# Patient Record
Sex: Male | Born: 1980 | Race: White | Hispanic: No | Marital: Married | Smoking: Never smoker
Health system: Southern US, Community
[De-identification: ages and names within clinical notes are randomized; demographics above are authoritative.]

## PROBLEM LIST (undated history)

## (undated) DIAGNOSIS — D689 Coagulation defect, unspecified: Secondary | ICD-10-CM

## (undated) HISTORY — DX: Coagulation defect, unspecified: D68.9

---

## 2014-08-20 ENCOUNTER — Ambulatory Visit (INDEPENDENT_AMBULATORY_CARE_PROVIDER_SITE_OTHER): Payer: BLUE CROSS/BLUE SHIELD

## 2014-08-20 ENCOUNTER — Ambulatory Visit (INDEPENDENT_AMBULATORY_CARE_PROVIDER_SITE_OTHER): Payer: BLUE CROSS/BLUE SHIELD | Admitting: Family Medicine

## 2014-08-20 VITALS — BP 112/78 | HR 95 | Temp 98.7°F | Resp 17 | Ht 67.0 in | Wt 239.6 lb

## 2014-08-20 DIAGNOSIS — R0989 Other specified symptoms and signs involving the circulatory and respiratory systems: Secondary | ICD-10-CM

## 2014-08-20 DIAGNOSIS — R509 Fever, unspecified: Secondary | ICD-10-CM | POA: Diagnosis not present

## 2014-08-20 DIAGNOSIS — R05 Cough: Secondary | ICD-10-CM

## 2014-08-20 DIAGNOSIS — J189 Pneumonia, unspecified organism: Secondary | ICD-10-CM | POA: Diagnosis not present

## 2014-08-20 DIAGNOSIS — R059 Cough, unspecified: Secondary | ICD-10-CM

## 2014-08-20 LAB — POCT CBC
Granulocyte percent: 70.4 %G (ref 37–80)
HCT, POC: 49.8 % (ref 43.5–53.7)
Hemoglobin: 16.3 g/dL (ref 14.1–18.1)
Lymph, poc: 1 (ref 0.6–3.4)
MCH, POC: 29.3 pg (ref 27–31.2)
MCHC: 32.7 g/dL (ref 31.8–35.4)
MCV: 89.5 fL (ref 80–97)
MID (cbc): 0.4 (ref 0–0.9)
MPV: 8.4 fL (ref 0–99.8)
POC Granulocyte: 3.2 (ref 2–6.9)
POC LYMPH PERCENT: 21.7 %L (ref 10–50)
POC MID %: 7.9 %M (ref 0–12)
Platelet Count, POC: 101 10*3/uL — AB (ref 142–424)
RBC: 5.56 M/uL (ref 4.69–6.13)
RDW, POC: 13 %
WBC: 4.6 10*3/uL (ref 4.6–10.2)

## 2014-08-20 LAB — POCT INFLUENZA A/B
Influenza A, POC: NEGATIVE
Influenza B, POC: NEGATIVE

## 2014-08-20 LAB — POCT SEDIMENTATION RATE: POCT SED RATE: 21 mm/hr (ref 0–22)

## 2014-08-20 MED ORDER — CEFTRIAXONE SODIUM 1 G IJ SOLR
1.0000 g | Freq: Once | INTRAMUSCULAR | Status: AC
  Administered 2014-08-20: 1 g via INTRAMUSCULAR

## 2014-08-20 MED ORDER — CEFTRIAXONE SODIUM 1 G IJ SOLR: 1.0000 g | INTRAMUSCULAR | Status: DC

## 2014-08-20 MED ORDER — AZITHROMYCIN 250 MG PO TABS
ORAL_TABLET | ORAL | Status: AC
Start: 2014-08-20 — End: ?

## 2014-08-20 MED ORDER — HYDROCODONE-HOMATROPINE 5-1.5 MG/5ML PO SYRP: 5.0000 mL | ORAL_SOLUTION | Freq: Three times a day (TID) | ORAL | Status: AC | PRN

## 2014-08-20 NOTE — Progress Notes (Signed)
° °  Subjective:    Patient ID: Brent Orr, male    DOB: 01/09/1981, 34 y.o.   MRN: [id number] This chart was scribed for Elvina SidleKurt Lauenstein, MD by Littie Deedsichard Sun, Medical Scribe. This patient was seen in Room 5 and the patient's care was started at 10:10 AM.   HPI HPI Comments: Brent Orr is a 34 y.o. male who presents to the [facility] complaining of gradual onset URI symptoms that started 3 days ago. Patient reports having fever, chest congestion, mild sore throat, mild nosebleed, a few episodes of vomiting, and productive cough (with blood). His fever has improved since onset 3 days ago. He has had sick contacts; they were also coughing up blood and had been diagnosed with bronchitis. Patient denies SOB and urinary symptoms. He also denies smoking. He has not received the flu shot this season.  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 He has 2 more years left of school.  Review of Systems  Constitutional: Positive for fever.  HENT: Positive for congestion, nosebleeds and sore throat.   Respiratory: Positive for cough. Negative for shortness of breath.   Gastrointestinal: Positive for vomiting.  Genitourinary: Negative.        Objective:   Physical Exam CONSTITUTIONAL: Well developed/well nourished HEAD: Normocephalic/atraumatic EYES: EOM/PERRL ENMT: Mucous membranes moist NECK: supple no meningeal signs SPINE: entire spine nontender CV: S1/S2 noted, no murmurs/rubs/gallops noted LUNGS: Lungs are clear to auscultation bilaterally, no apparent distress ABDOMEN: soft, nontender, no rebound or guarding GU: no cva tenderness NEURO: Pt is awake/alert, moves all extremitiesx4 EXTREMITIES: pulses normal, full ROM SKIN: warm, color normal PSYCH: no abnormalities of mood noted  [facility] (PRIMARY) x-ray report read by Dr. Milus GlazierLauenstein: Increased markings in right hilum and right lower lobe without definite infiltrate.       Assessment & Plan:   This  chart was scribed in my presence and reviewed by me personally.    ICD-9-CM ICD-10-CM   1. Cough 786.2 R05 POCT CBC     POCT Influenza A/B     POCT SEDIMENTATION RATE     DG Chest 2 View     azithromycin (ZITHROMAX Z-PAK) 250 MG tablet     HYDROcodone-homatropine (HYCODAN) 5-1.5 MG/5ML syrup     DISCONTINUED: cefTRIAXone (ROCEPHIN) injection 1 g  2. Fever, unspecified fever cause 780.60 R50.9 POCT CBC     POCT Influenza A/B     POCT SEDIMENTATION RATE     DG Chest 2 View     azithromycin (ZITHROMAX Z-PAK) 250 MG tablet     HYDROcodone-homatropine (HYCODAN) 5-1.5 MG/5ML syrup     DISCONTINUED: cefTRIAXone (ROCEPHIN) injection 1 g  3. Chest congestion 786.9 R09.89 POCT CBC     POCT Influenza A/B     POCT SEDIMENTATION RATE     DG Chest 2 View     azithromycin (ZITHROMAX Z-PAK) 250 MG tablet     HYDROcodone-homatropine (HYCODAN) 5-1.5 MG/5ML syrup     DISCONTINUED: cefTRIAXone (ROCEPHIN) injection 1 g  4. CAP (community acquired pneumonia) 486 J18.9 azithromycin (ZITHROMAX Z-PAK) 250 MG tablet     HYDROcodone-homatropine (HYCODAN) 5-1.5 MG/5ML syrup     cefTRIAXone (ROCEPHIN) injection 1 g     Signed, Elvina SidleKurt Lauenstein, MD

## 2014-08-20 NOTE — Patient Instructions (Signed)
He appeared to have an early pneumonia. I want you stay out of classroom and work for 48 hours but please return on Wednesday after 2 so we can recheck her you're doing.

## 2014-08-22 ENCOUNTER — Ambulatory Visit (INDEPENDENT_AMBULATORY_CARE_PROVIDER_SITE_OTHER): Payer: BLUE CROSS/BLUE SHIELD | Admitting: Family Medicine

## 2014-08-22 VITALS — BP 110/74 | HR 75 | Temp 98.8°F | Resp 20 | Ht 67.0 in | Wt 240.2 lb

## 2014-08-22 DIAGNOSIS — R0989 Other specified symptoms and signs involving the circulatory and respiratory systems: Secondary | ICD-10-CM | POA: Diagnosis not present

## 2014-08-22 DIAGNOSIS — J189 Pneumonia, unspecified organism: Secondary | ICD-10-CM | POA: Diagnosis not present

## 2014-08-22 DIAGNOSIS — R05 Cough: Secondary | ICD-10-CM

## 2014-08-22 DIAGNOSIS — R059 Cough, unspecified: Secondary | ICD-10-CM

## 2014-08-22 NOTE — Progress Notes (Signed)
Patient ID: Brent Orr, male   DOB: 1980-10-17, 34 y.o.   MRN: [id number]   This chart was scribed for Brent Sidle, MD by [facility], medical scribe at [facility].The patient was seen in exam room 11 and the patient's care was started at 2:42 PM.  Patient ID: Brent Orr MRN: [id number], DOB: 04-Sep-1980, 34 y.o. Date of Encounter: 08/22/2014  Primary Physician: No primary care provider on file.  Chief Complaint:  Chief Complaint  Patient presents with  . Follow-up    Dr. Elbert Ewings   HPI:  Brent Orr is a 34 y.o. male who presents to [facility] for a follow-up. He was last seen on 08/20/2014 diagnosed with pneumonia with associated cough, fever, and chest congestion. He was given a Z-pak which has provided relief.  Pt states his symptoms have improved, but his cough has persisted however he is no longer producing bloody phlegm. His energy levels are improving as well. Sleeping has improved.  Student at [employer].   Past Medical History  Diagnosis Date  . Clotting disorder     Home Meds: Prior to Admission medications   Medication Sig Start Date End Date Taking? Authorizing Provider  azithromycin (ZITHROMAX Z-PAK) 250 MG tablet Take as directed on pack 08/20/14  Yes Brent Sidle, MD  HYDROcodone-homatropine [facility]) 5-1.5 MG/5ML syrup Take 5 mLs by mouth every 8 (eight) hours as needed for cough. 08/20/14  Yes Brent Sidle, MD   Allergies:  Allergies  Allergen Reactions  . Keflex [Cephalexin]    History   Social History  . Marital Status: Married    Spouse Name: N/A  . Number of Children: N/A  . Years of Education: N/A   Occupational History  . Not on file.   Social History Main Topics  . Smoking status: Never Smoker   . Smokeless tobacco: Not on file  . Alcohol Use: Not on file  . Drug Use: Not on file  . Sexual Activity: Not on file   Other Topics Concern  . Not on file   Social History Narrative    Review of  Systems: Constitutional: negative for chills, fever, night sweats, weight changes, or fatigue  HEENT: negative for vision changes, hearing loss, congestion, rhinorrhea, ST, epistaxis, or sinus pressure Cardiovascular: negative for chest pain or palpitations Respiratory: negative for hemoptysis, shortness of breath. Positive for cough and wheezing. Abdominal: negative for abdominal pain, nausea, vomiting, diarrhea, or constipation Dermatological: negative for rash Neurologic: negative for headache, dizziness, or syncope All other systems reviewed and are otherwise negative with the exception to those above and in the HPI.  Physical Exam: Blood pressure 110/74, pulse 75, temperature 98.8 F (37.1 C), temperature source Oral, resp. rate 20, height  (1.702 m), weight 240 lb 3.2 oz (108.954 kg), SpO2 98 %., Body mass index is 37.61 kg/(m^2). General: Well developed, well nourished, in no acute distress. Head: Normocephalic, atraumatic, eyes without discharge, sclera non-icteric, nares are without discharge. Bilateral auditory canals clear, TM's are without perforation, pearly grey and translucent with reflective cone of light bilaterally. Oral cavity moist, posterior pharynx without exudate, erythema, peritonsillar abscess, or post nasal drip.  Neck: Supple. No thyromegaly. Full ROM. No lymphadenopathy. Lungs: Breathing is unlabored. Rales in the left base. Heart: RRR with S1 S2. No murmurs, rubs, or gallops appreciated. Abdomen: Soft, non-tender, non-distended with normoactive bowel sounds. No hepatomegaly. No rebound/guarding. No obvious abdominal masses. Msk:  Strength and tone normal for age. Extremities/Skin: Warm and  dry. No clubbing or cyanosis. No edema. No rashes or suspicious lesions. Neuro: Alert and oriented X 3. Moves all extremities spontaneously. Gait is normal. CNII-XII grossly in tact. Psych:  Responds to questions appropriately with a normal affect.   Labs:  ASSESSMENT AND  PLAN:  34 y.o. year old male with  This chart was scribed in my presence and reviewed by me personally.    ICD-9-CM ICD-10-CM   1. Cough 786.2 R05   2. Chest congestion 786.9 R09.89   3. CAP (community acquired pneumonia) 486 J18.9     Return to school tomorrow, finish the antibiotics   Signed, Brent SidleKurt Tametra Ahart, MD 08/22/2014 2:42 PM

## 2015-07-16 ENCOUNTER — Telehealth: Payer: Self-pay

## 2015-07-16 DIAGNOSIS — Z20828 Contact with and (suspected) exposure to other viral communicable diseases: Secondary | ICD-10-CM

## 2015-07-16 NOTE — Telephone Encounter (Signed)
Pt states his wife have been diagnosed with the flu and he would like to have [place] call him in some Tamiflu. Please call pt at [phone]    [facility] [address]

## 2015-07-16 NOTE — Telephone Encounter (Signed)
Does he have to come in.

## 2015-07-17 MED ORDER — OSELTAMIVIR PHOSPHATE 75 MG PO CAPS: 75.0000 mg | ORAL_CAPSULE | Freq: Every day | ORAL | Status: AC

## 2015-07-17 NOTE — Telephone Encounter (Signed)
I have called tamiflu in. He will take once a day for 10 days. Be sure to make pt aware of side effects - nausea, vomiting, diarrhea, headache.

## 2015-07-18 NOTE — Telephone Encounter (Signed)
LM Rx sent in. 

## 2015-10-23 IMAGING — CR DG CHEST 2V
2 series · 2 of 2 positions shown · non-contrast
Comparison: None.

CLINICAL DATA: Three-day history of cough and congestion

EXAM:
CHEST  2 VIEW

[PA]
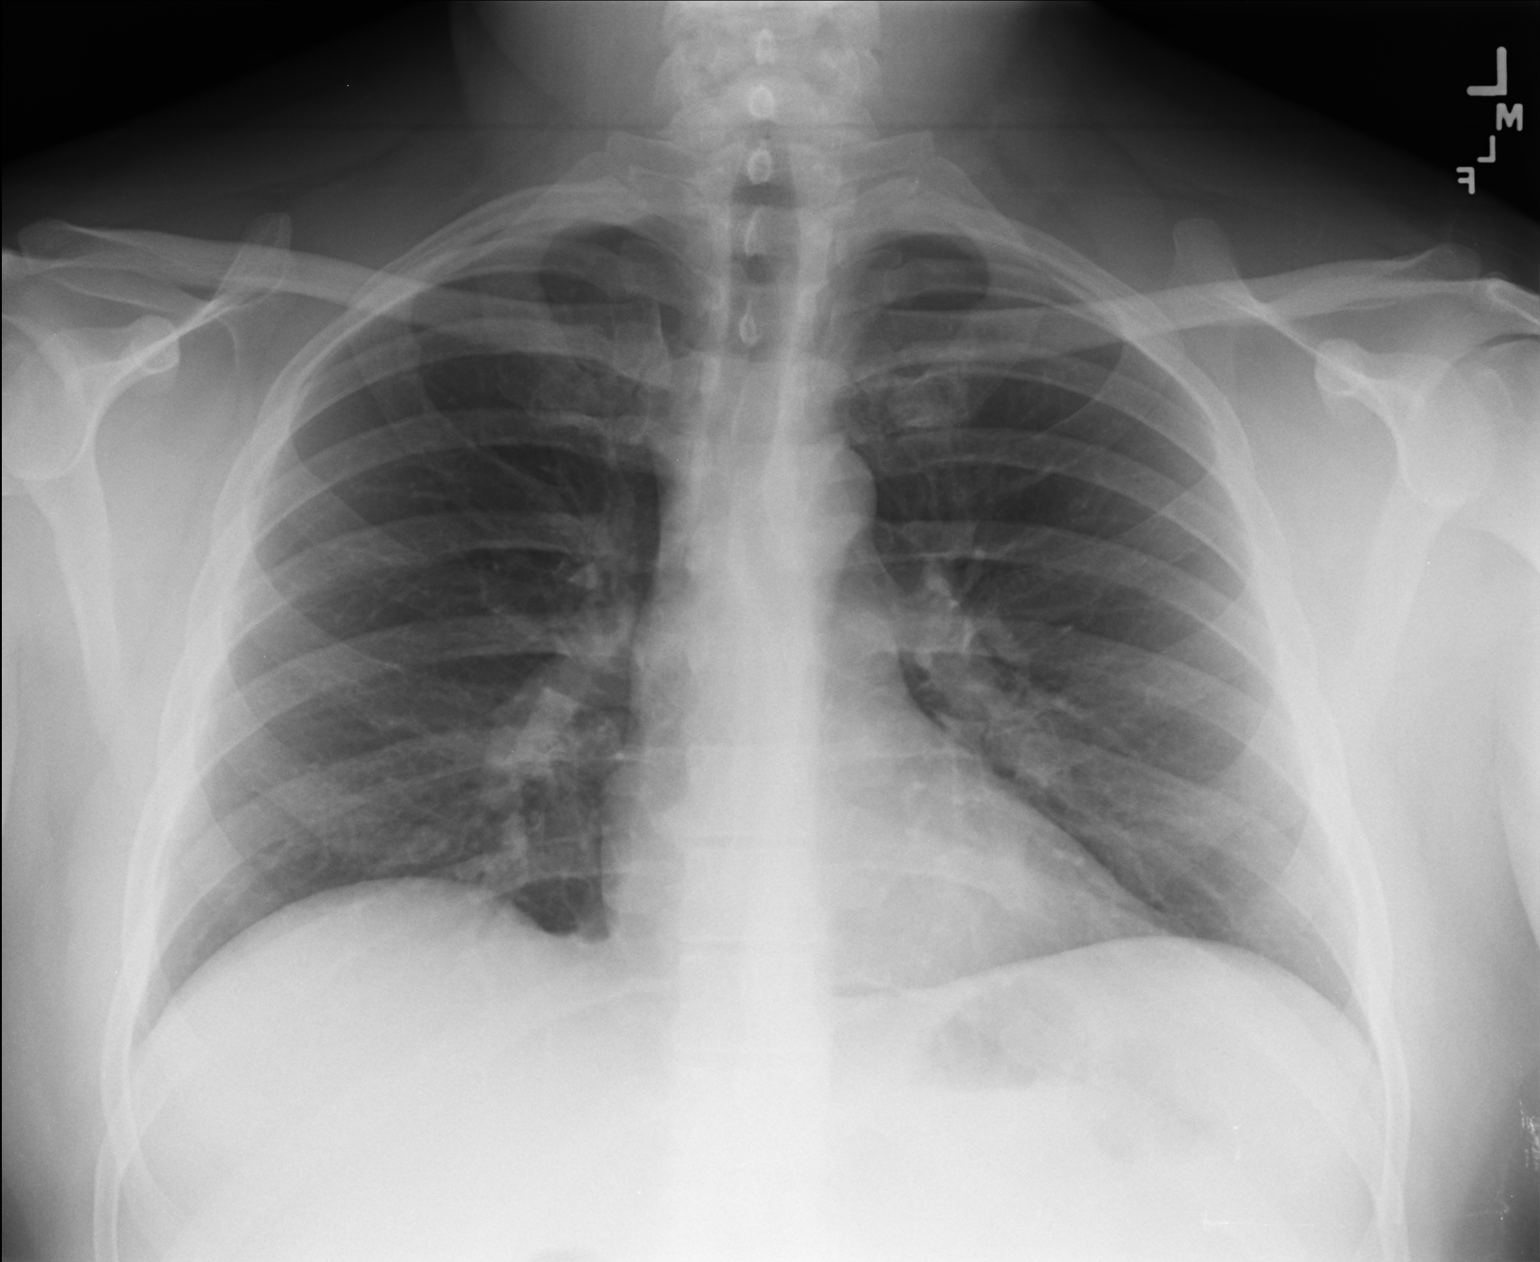

[lateral]
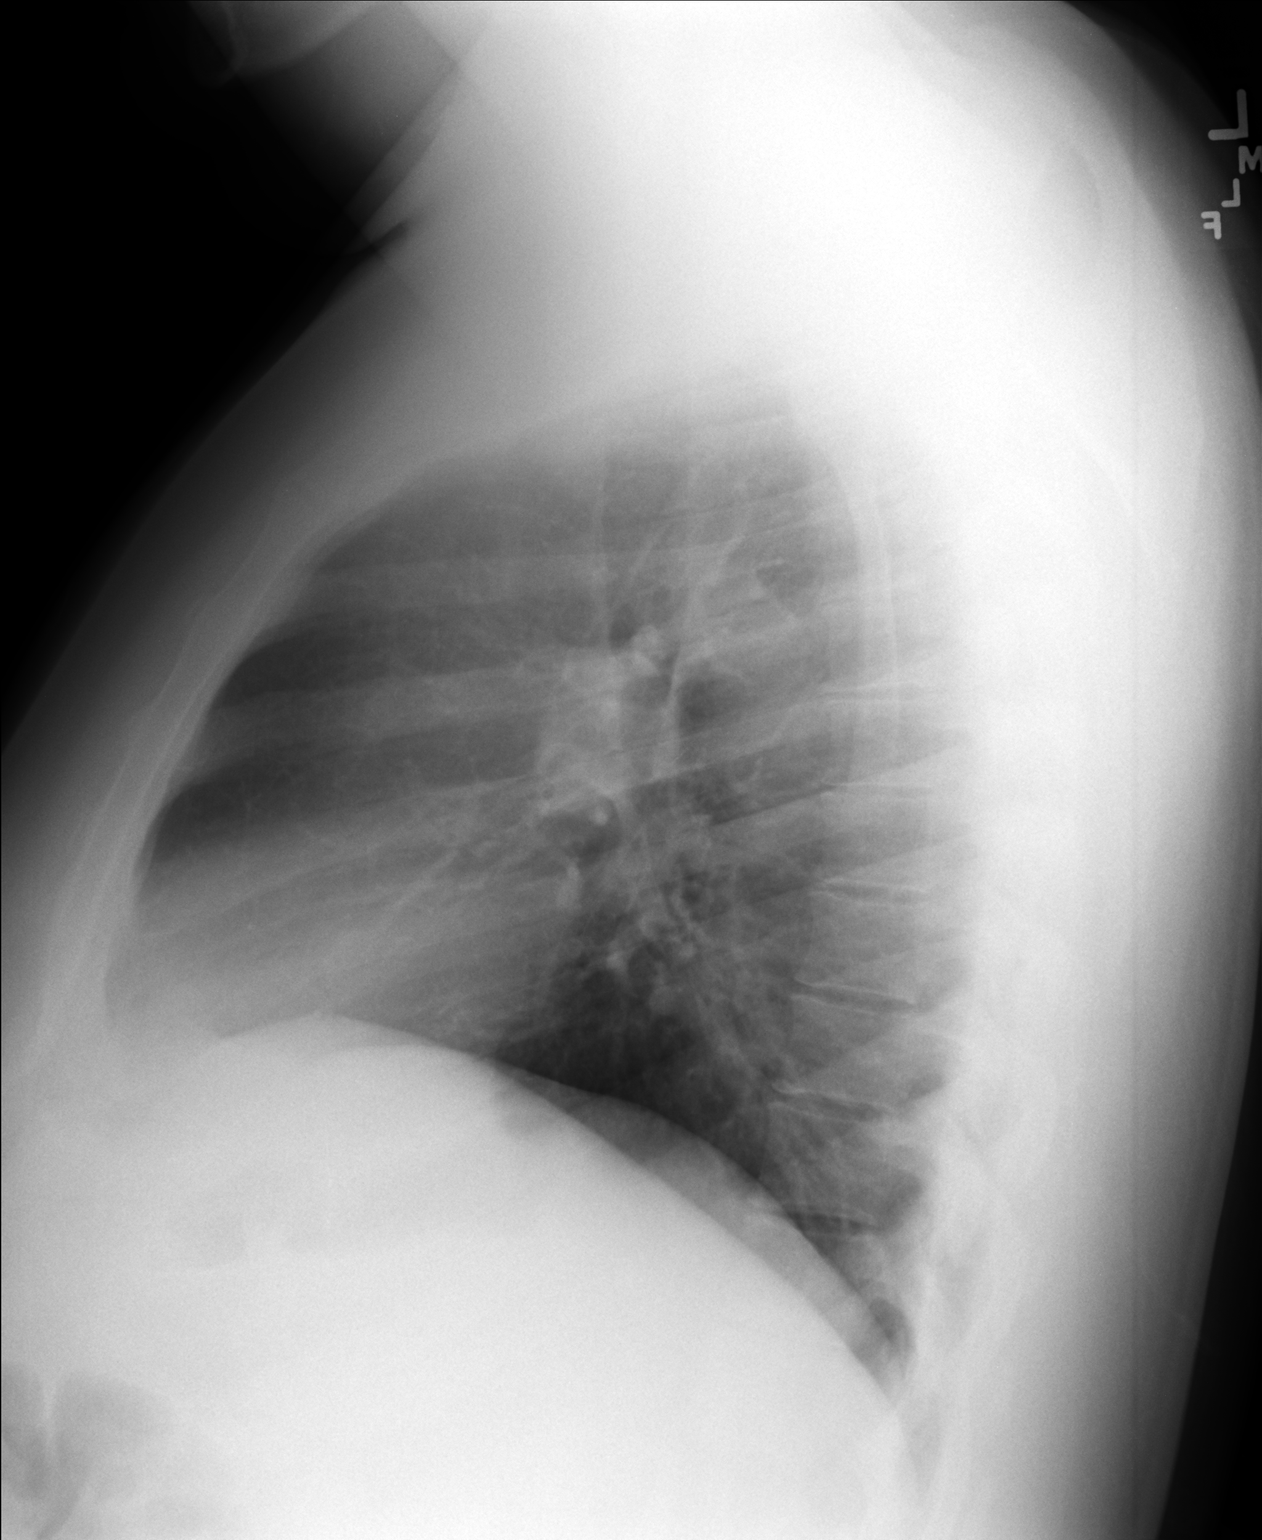

[2 of 2 positions shown; findings below may reference images not displayed]

FINDINGS: There is no edema or consolidation. The heart size and pulmonary
vascularity are normal. No adenopathy. No bone lesions.
IMPRESSION: No edema or consolidation.
